# Patient Record
Sex: Female | Born: 1960 | Race: White | Hispanic: No | Smoking: Current every day smoker
Health system: Southern US, Community
[De-identification: ages and names within clinical notes are randomized; demographics above are authoritative.]

## PROBLEM LIST (undated history)

## (undated) DIAGNOSIS — C44320 Squamous cell carcinoma of skin of unspecified parts of face: Secondary | ICD-10-CM

## (undated) DIAGNOSIS — E042 Nontoxic multinodular goiter: Secondary | ICD-10-CM

## (undated) DIAGNOSIS — E039 Hypothyroidism, unspecified: Secondary | ICD-10-CM

## (undated) DIAGNOSIS — H3509 Other intraretinal microvascular abnormalities: Secondary | ICD-10-CM

## (undated) HISTORY — DX: Nontoxic multinodular goiter: E04.2

## (undated) HISTORY — DX: Squamous cell carcinoma of skin of unspecified parts of face: C44.320

## (undated) HISTORY — DX: Hypothyroidism, unspecified: E03.9

## (undated) HISTORY — DX: Other intraretinal microvascular abnormalities: H35.09

## (undated) HISTORY — PX: ABDOMINAL HYSTERECTOMY: SHX81

---

## 1998-05-26 ENCOUNTER — Other Ambulatory Visit: Admission: RE | Admit: 1998-05-26 | Discharge: 1998-05-26 | Payer: Self-pay | Admitting: Orthopedic Surgery

## 1998-06-30 ENCOUNTER — Other Ambulatory Visit: Admission: RE | Admit: 1998-06-30 | Discharge: 1998-06-30 | Payer: Self-pay | Admitting: Obstetrics & Gynecology

## 1999-07-08 ENCOUNTER — Other Ambulatory Visit: Admission: RE | Admit: 1999-07-08 | Discharge: 1999-07-08 | Payer: Self-pay | Admitting: Obstetrics & Gynecology

## 1999-10-27 ENCOUNTER — Encounter: Admission: RE | Admit: 1999-10-27 | Discharge: 1999-10-27 | Payer: Self-pay | Admitting: Diagnostic Radiology

## 2000-09-22 ENCOUNTER — Encounter: Admission: RE | Admit: 2000-09-22 | Discharge: 2000-09-22 | Payer: Self-pay | Admitting: Diagnostic Radiology

## 2000-09-26 ENCOUNTER — Other Ambulatory Visit: Admission: RE | Admit: 2000-09-26 | Discharge: 2000-09-26 | Payer: Self-pay | Admitting: Obstetrics & Gynecology

## 2001-09-15 ENCOUNTER — Encounter: Admission: RE | Admit: 2001-09-15 | Discharge: 2001-09-15 | Payer: Self-pay | Admitting: Diagnostic Radiology

## 2001-12-07 ENCOUNTER — Other Ambulatory Visit: Admission: RE | Admit: 2001-12-07 | Discharge: 2001-12-07 | Payer: Self-pay | Admitting: Obstetrics & Gynecology

## 2003-01-01 ENCOUNTER — Other Ambulatory Visit: Admission: RE | Admit: 2003-01-01 | Discharge: 2003-01-01 | Payer: Self-pay | Admitting: Obstetrics & Gynecology

## 2003-11-21 ENCOUNTER — Encounter: Admission: RE | Admit: 2003-11-21 | Discharge: 2003-11-21 | Payer: Self-pay | Admitting: Obstetrics & Gynecology

## 2004-01-17 ENCOUNTER — Other Ambulatory Visit: Admission: RE | Admit: 2004-01-17 | Discharge: 2004-01-17 | Payer: Self-pay | Admitting: Obstetrics & Gynecology

## 2005-01-29 ENCOUNTER — Encounter: Admission: RE | Admit: 2005-01-29 | Discharge: 2005-01-29 | Payer: Self-pay | Admitting: Obstetrics & Gynecology

## 2005-02-26 ENCOUNTER — Other Ambulatory Visit: Admission: RE | Admit: 2005-02-26 | Discharge: 2005-02-26 | Payer: Self-pay | Admitting: Obstetrics & Gynecology

## 2005-11-09 ENCOUNTER — Encounter: Admission: RE | Admit: 2005-11-09 | Discharge: 2005-11-09 | Payer: Self-pay | Admitting: Obstetrics & Gynecology

## 2005-12-07 ENCOUNTER — Encounter: Admission: RE | Admit: 2005-12-07 | Discharge: 2005-12-07 | Payer: Self-pay | Admitting: Obstetrics & Gynecology

## 2006-05-17 ENCOUNTER — Encounter: Admission: RE | Admit: 2006-05-17 | Discharge: 2006-05-17 | Payer: Self-pay | Admitting: Obstetrics & Gynecology

## 2006-06-13 ENCOUNTER — Encounter: Admission: RE | Admit: 2006-06-13 | Discharge: 2006-06-13 | Payer: Self-pay | Admitting: Family Medicine

## 2007-01-02 ENCOUNTER — Encounter: Admission: RE | Admit: 2007-01-02 | Discharge: 2007-01-31 | Payer: Self-pay | Admitting: Family Medicine

## 2007-01-28 IMAGING — MG MM SCREEN MAMMOGRAM BILATERAL
5 series · 5 of 5 positions shown · non-contrast
Comparison: none

DG SCREEN MAMMOGRAM BILATERAL
Bilateral CC and MLO view(s) were taken.
Prior study comparison: November 21, 2003, bilateral screening mammogram.

SCREENING MAMMOGRAM:
The breast tissue is extremely dense.  There is no dominant mass, architectural distortion or 
calcification to suggest malignancy.

[R CC]
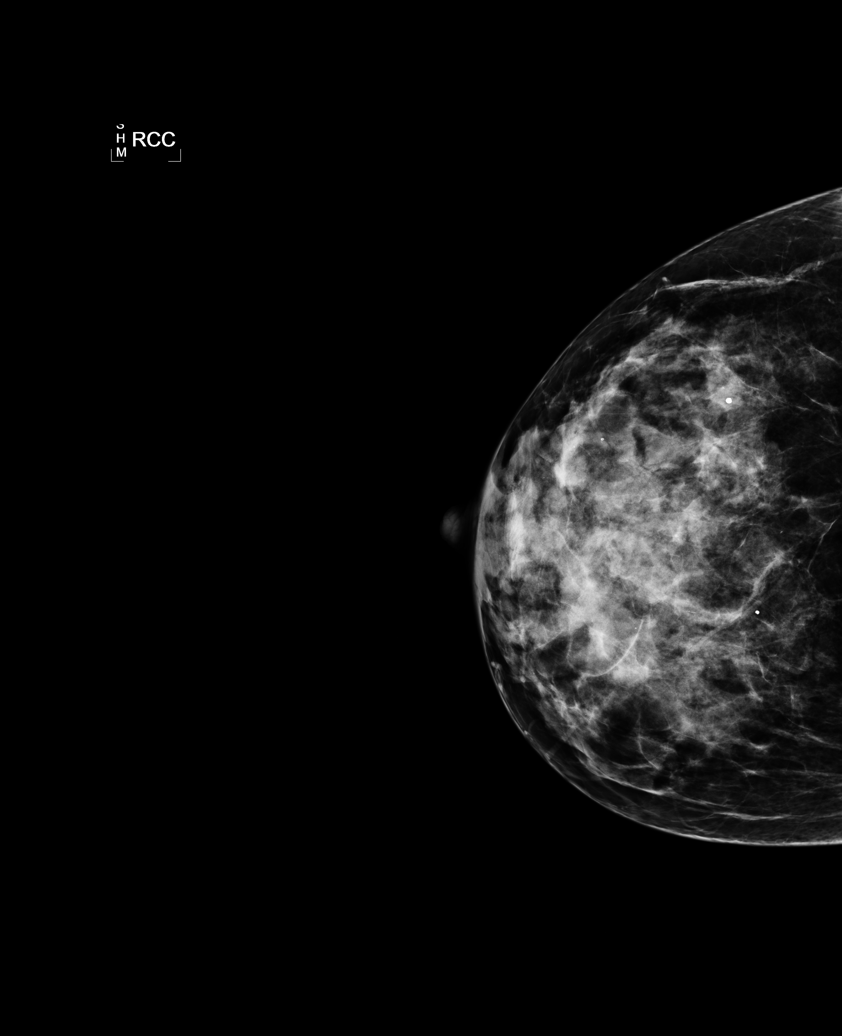

[L CC]
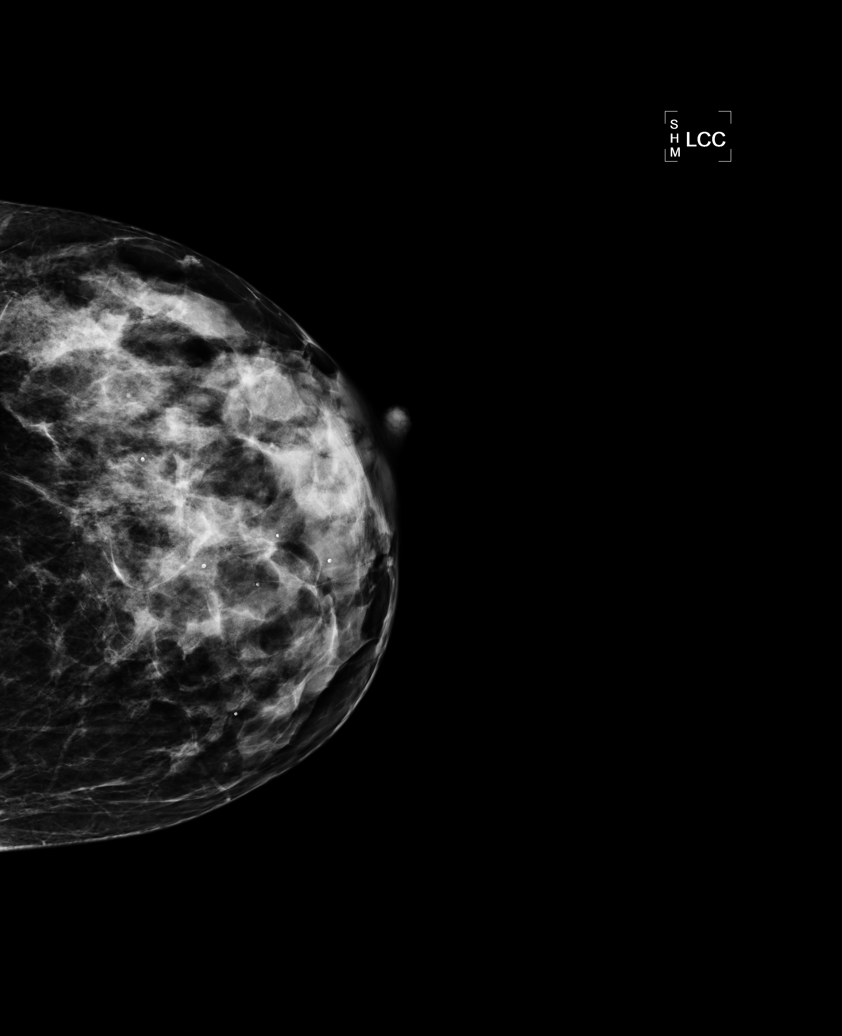

[L MLO (1 of 2)]
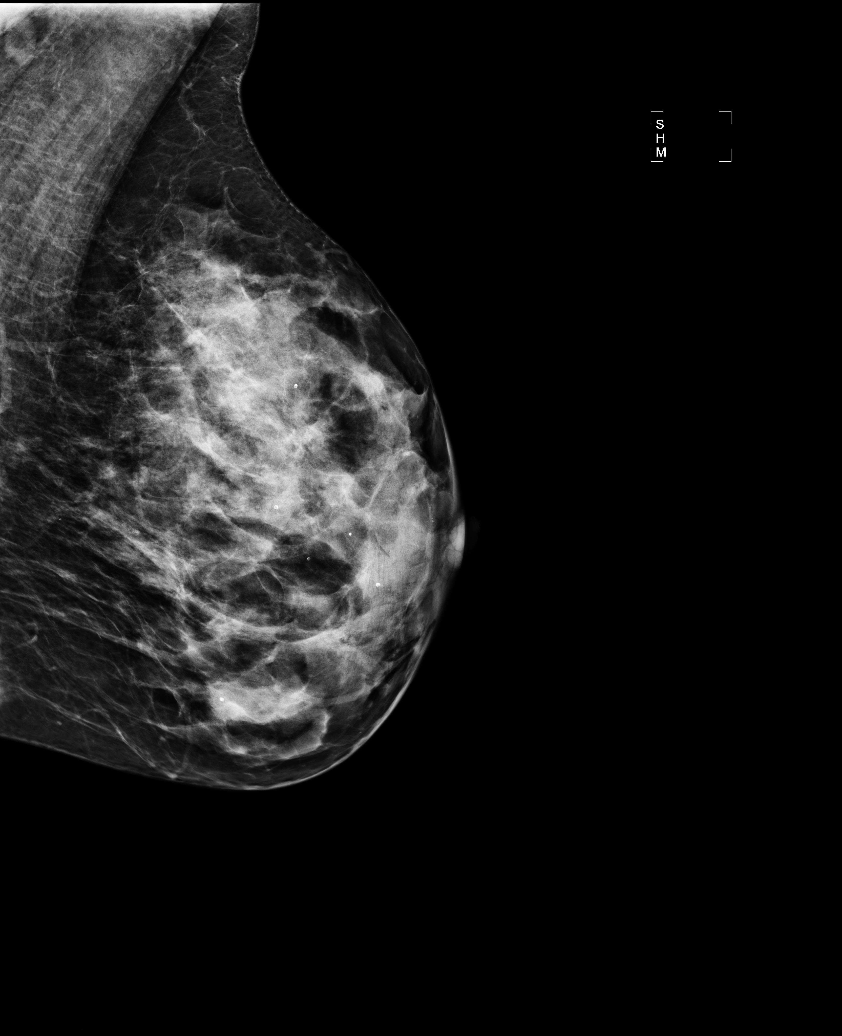

[R MLO]
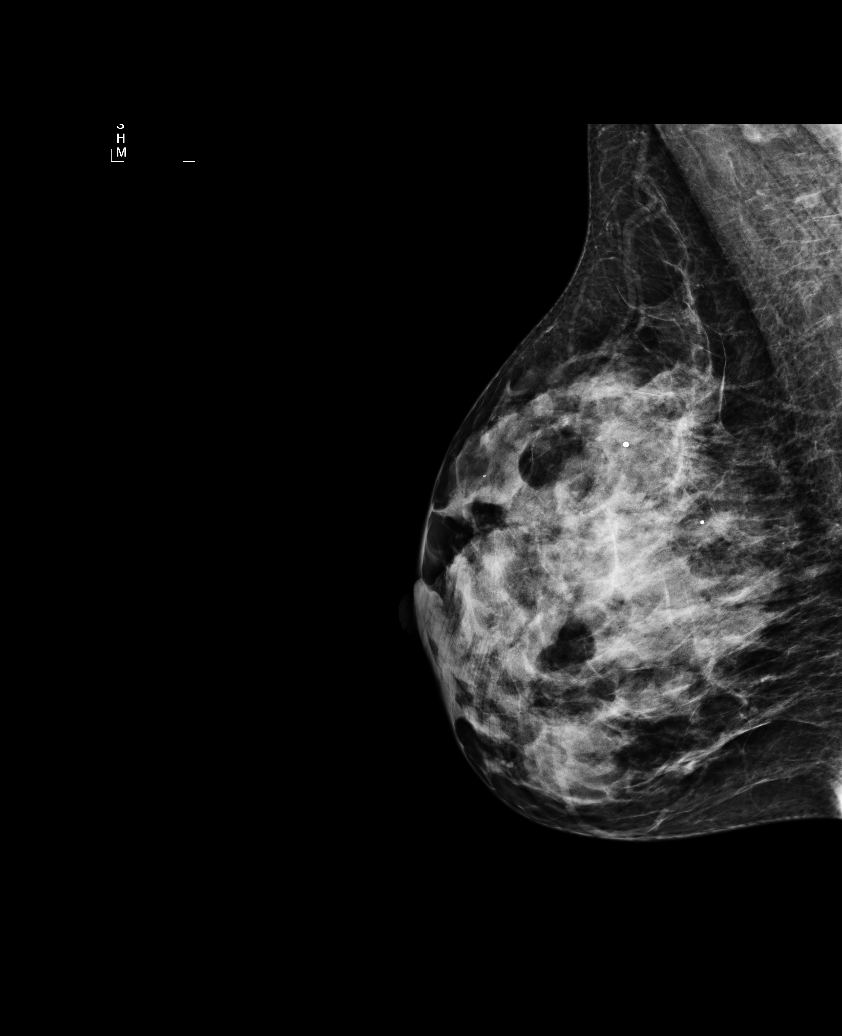

[L MLO (2 of 2)]
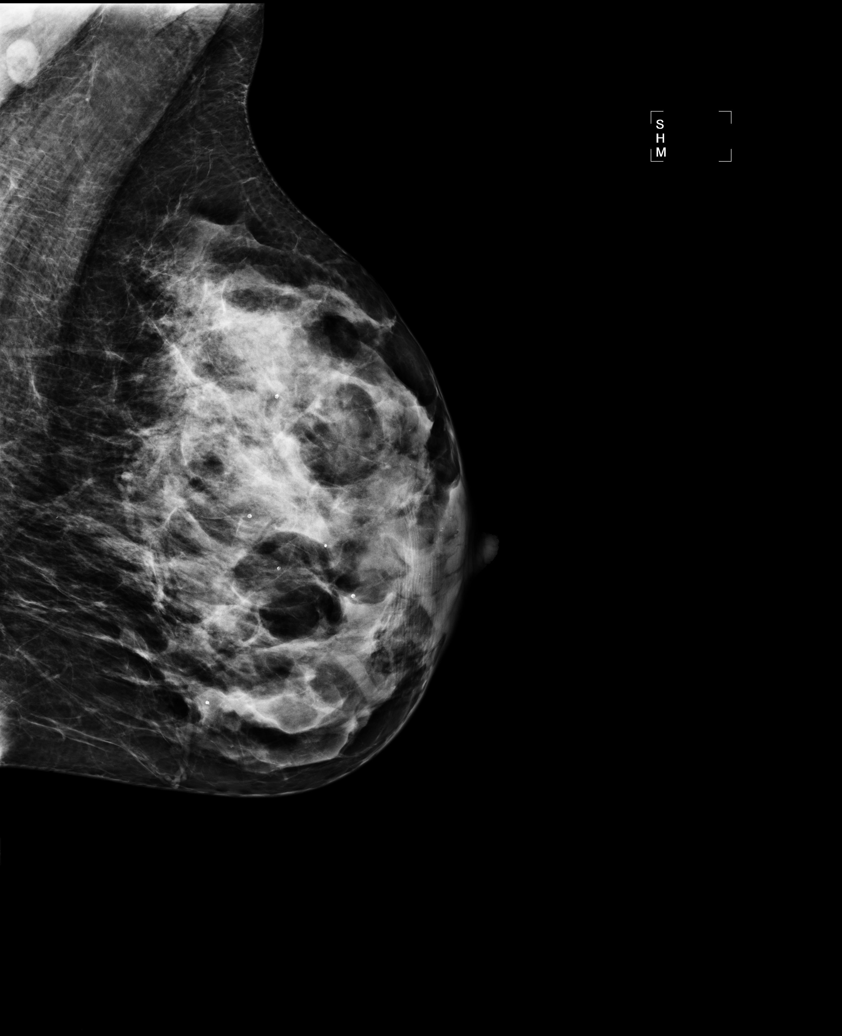

[5 of 5 positions shown; findings below may reference images not displayed]

IMPRESSION: No mammographic evidence of malignancy.  Suggest yearly screening mammography.

ASSESSMENT: Negative - BI-RADS 1

Screening mammogram in 1 year.
ANALYZED BY COMPUTER AIDED DETECTION. , THIS PROCEDURE WAS A DIGITAL MAMMOGRAM.

## 2007-10-10 ENCOUNTER — Encounter: Admission: RE | Admit: 2007-10-10 | Discharge: 2007-10-10 | Payer: Self-pay | Admitting: Family Medicine

## 2008-06-13 ENCOUNTER — Encounter: Admission: RE | Admit: 2008-06-13 | Discharge: 2008-06-13 | Payer: Self-pay | Admitting: Obstetrics & Gynecology

## 2008-11-28 ENCOUNTER — Encounter: Admission: RE | Admit: 2008-11-28 | Discharge: 2008-11-28 | Payer: Self-pay | Admitting: Family Medicine

## 2010-01-14 ENCOUNTER — Encounter: Admission: RE | Admit: 2010-01-14 | Discharge: 2010-01-14 | Payer: Self-pay | Admitting: Family Medicine

## 2010-07-17 ENCOUNTER — Encounter: Admission: RE | Admit: 2010-07-17 | Discharge: 2010-07-17 | Payer: Self-pay | Admitting: Obstetrics & Gynecology

## 2010-11-28 ENCOUNTER — Encounter: Payer: Self-pay | Admitting: Family Medicine

## 2010-11-28 ENCOUNTER — Encounter: Payer: Self-pay | Admitting: Obstetrics & Gynecology

## 2010-11-28 ENCOUNTER — Encounter: Payer: Self-pay | Admitting: Orthopedic Surgery

## 2010-11-29 ENCOUNTER — Encounter: Payer: Self-pay | Admitting: Gynecology

## 2010-11-29 ENCOUNTER — Encounter: Payer: Self-pay | Admitting: Radiology

## 2010-11-29 ENCOUNTER — Encounter: Payer: Self-pay | Admitting: Internal Medicine

## 2010-11-29 ENCOUNTER — Encounter: Payer: Self-pay | Admitting: Family Medicine

## 2010-11-30 ENCOUNTER — Encounter: Payer: Self-pay | Admitting: Orthopedic Surgery

## 2010-11-30 ENCOUNTER — Encounter: Payer: Self-pay | Admitting: Family Medicine

## 2010-11-30 ENCOUNTER — Encounter: Payer: Self-pay | Admitting: Obstetrics & Gynecology

## 2011-01-26 ENCOUNTER — Other Ambulatory Visit: Payer: Self-pay | Admitting: Obstetrics and Gynecology

## 2011-01-26 ENCOUNTER — Other Ambulatory Visit: Payer: Self-pay | Admitting: Family Medicine

## 2011-03-16 ENCOUNTER — Other Ambulatory Visit: Payer: Self-pay | Admitting: Family Medicine

## 2011-03-19 ENCOUNTER — Other Ambulatory Visit: Payer: Self-pay | Admitting: Family Medicine

## 2011-03-30 ENCOUNTER — Other Ambulatory Visit: Payer: Self-pay | Admitting: Podiatrist

## 2011-03-30 ENCOUNTER — Other Ambulatory Visit: Payer: Self-pay | Admitting: Orthopedic Surgery

## 2011-08-17 ENCOUNTER — Other Ambulatory Visit: Payer: Self-pay | Admitting: Family Medicine

## 2011-08-17 DIAGNOSIS — R51 Headache: Secondary | ICD-10-CM

## 2011-11-10 ENCOUNTER — Other Ambulatory Visit: Payer: Self-pay | Admitting: Pediatrics

## 2011-11-19 ENCOUNTER — Other Ambulatory Visit: Payer: Self-pay | Admitting: Family Medicine

## 2011-12-09 ENCOUNTER — Other Ambulatory Visit: Payer: Self-pay | Admitting: Family Medicine

## 2012-10-09 ENCOUNTER — Other Ambulatory Visit: Payer: Self-pay | Admitting: Family Medicine

## 2012-10-09 DIAGNOSIS — Z1231 Encounter for screening mammogram for malignant neoplasm of breast: Secondary | ICD-10-CM

## 2012-11-10 ENCOUNTER — Ambulatory Visit: Payer: Self-pay

## 2012-12-18 ENCOUNTER — Ambulatory Visit
Admission: RE | Admit: 2012-12-18 | Discharge: 2012-12-18 | Disposition: A | Discharge status: Home / Self Care | Payer: PRIVATE HEALTH INSURANCE | Source: Ambulatory Visit | Attending: Family Medicine | Admitting: Family Medicine

## 2012-12-18 DIAGNOSIS — Z1231 Encounter for screening mammogram for malignant neoplasm of breast: Secondary | ICD-10-CM

## 2013-04-10 ENCOUNTER — Other Ambulatory Visit: Payer: Self-pay | Admitting: Optometry

## 2013-04-10 DIAGNOSIS — I878 Other specified disorders of veins: Secondary | ICD-10-CM

## 2013-04-10 DIAGNOSIS — H35032 Hypertensive retinopathy, left eye: Secondary | ICD-10-CM

## 2013-04-16 ENCOUNTER — Other Ambulatory Visit: Payer: PRIVATE HEALTH INSURANCE

## 2013-04-16 ENCOUNTER — Ambulatory Visit
Admission: RE | Admit: 2013-04-16 | Discharge: 2013-04-16 | Disposition: A | Discharge status: Home / Self Care | Payer: PRIVATE HEALTH INSURANCE | Source: Ambulatory Visit | Attending: Optometry | Admitting: Optometry

## 2013-04-16 DIAGNOSIS — H35032 Hypertensive retinopathy, left eye: Secondary | ICD-10-CM

## 2013-04-16 DIAGNOSIS — I878 Other specified disorders of veins: Secondary | ICD-10-CM

## 2013-08-07 ENCOUNTER — Encounter: Payer: Self-pay | Admitting: *Deleted

## 2013-08-22 ENCOUNTER — Other Ambulatory Visit (HOSPITAL_COMMUNITY): Payer: Self-pay | Admitting: Cardiology

## 2013-08-22 ENCOUNTER — Other Ambulatory Visit: Payer: Self-pay | Admitting: Cardiology

## 2013-08-22 ENCOUNTER — Encounter: Payer: Self-pay | Admitting: Nurse Practitioner

## 2013-08-22 ENCOUNTER — Ambulatory Visit (INDEPENDENT_AMBULATORY_CARE_PROVIDER_SITE_OTHER): Payer: PRIVATE HEALTH INSURANCE | Admitting: Nurse Practitioner

## 2013-08-22 ENCOUNTER — Ambulatory Visit (HOSPITAL_COMMUNITY): Payer: PRIVATE HEALTH INSURANCE | Attending: Cardiology | Admitting: Cardiology

## 2013-08-22 VITALS — BP 113/83 | HR 90

## 2013-08-22 DIAGNOSIS — R079 Chest pain, unspecified: Secondary | ICD-10-CM | POA: Insufficient documentation

## 2013-08-22 DIAGNOSIS — I253 Aneurysm of heart: Secondary | ICD-10-CM | POA: Insufficient documentation

## 2013-08-22 DIAGNOSIS — R0789 Other chest pain: Secondary | ICD-10-CM

## 2013-08-22 DIAGNOSIS — R9439 Abnormal result of other cardiovascular function study: Secondary | ICD-10-CM

## 2013-08-22 DIAGNOSIS — F17200 Nicotine dependence, unspecified, uncomplicated: Secondary | ICD-10-CM | POA: Insufficient documentation

## 2013-08-22 NOTE — Progress Notes (Signed)
Exercise Treadmill Test  Pre-Exercise Testing Evaluation Rhythm: normal sinus  Rate: 66     Test  Exercise Tolerance Test Ordering MD: Donato Schultz, MD  Interpreting MD: Norma Fredrickson, NP  Unique Test No: 1  Treadmill:  1  Indication for ETT: chest pain - rule out ischemia  Contraindication to ETT: No   Stress Modality: exercise - treadmill  Cardiac Imaging Performed: non   Protocol: standard Bruce - maximal  Max BP:  185/81  Max MPHR (bpm):  155 85% MPR (bpm):  145  MPHR obtained (bpm):  170 % MPHR obtained:    Reached 85% MPHR (min:sec):  6:10 Total Exercise Time (min-sec):  8 minutes  Workload in METS:  10.1 Borg Scale: 15  Reason ETT Terminated:  patient's desire to stop    ST Segment Analysis At Rest: normal ST segments - no evidence of significant ST depression With Exercise: borderline ST changes with ST upsloping  Other Information Arrhythmia:  No Angina during ETT:  absent (0) Quality of ETT:  non-diagnostic  ETT Interpretation:  borderline (indeterminate) with non-specific ST changes  Comments: Patient presents today for routine GXT. Has had recurrent retinal vein occlusion noted on eye exams. Negative carotid duplex. She does smoke but trying to quit. Some atypical chest pain. Has had echo earlier today.   Today the patient exercised on the standard Bruce protocol for a total of 8 minutes.  Fair exercise tolerance.  Adequate blood pressure response.  Clinically negative for chest pain. Test was stopped due to fatigue.  EKG with greater than 2 mm of ST upsloping at peak of exercise. No significant arrhythmia noted.   Recommendations: Tracings have been reviewed with Dr. Anne Fu - will proceed with stress Myoview.  I have asked her to continue to work on smoking cessation. Start baby aspirin as well.   Further disposition to follow.   Patient is agreeable to this plan and will call if any problems develop in the interim.   Rosalio Macadamia, RN, ANP-C [facility] [address] [place], [place]  [address]

## 2013-08-22 NOTE — Progress Notes (Deleted)
   Patient ID: Robin Torres, female    DOB: 04/02/1961, 52 y.o.   MRN: [id number]  HPI    Review of Systems    Physical Exam

## 2013-08-22 NOTE — Progress Notes (Signed)
Echo performed. 

## 2013-08-22 NOTE — Patient Instructions (Signed)
Your physician has requested that you have en exercise stress myoview. For further information please visit [email or web address]. Please follow instruction sheet, as given.   

## 2013-08-22 NOTE — Progress Notes (Signed)
Discussed with Lawson Fiscal. We will proceed with nuclear stress test. Abnormal. ST segment depression, upsloping but greater than 2 mm.

## 2013-09-11 ENCOUNTER — Encounter (HOSPITAL_COMMUNITY): Payer: PRIVATE HEALTH INSURANCE

## 2013-09-27 ENCOUNTER — Encounter: Payer: Self-pay | Admitting: Podiatry

## 2013-09-27 ENCOUNTER — Ambulatory Visit (INDEPENDENT_AMBULATORY_CARE_PROVIDER_SITE_OTHER): Payer: PRIVATE HEALTH INSURANCE | Admitting: Podiatry

## 2013-09-27 VITALS — Ht 67.0 in | Wt 130.0 lb

## 2013-09-27 DIAGNOSIS — L600 Ingrowing nail: Secondary | ICD-10-CM

## 2013-09-27 DIAGNOSIS — B351 Tinea unguium: Secondary | ICD-10-CM

## 2013-09-27 NOTE — Patient Instructions (Signed)

## 2013-09-27 NOTE — Progress Notes (Signed)
N-SORE, DISCOLORATION L-B/L TOENAIL D-2 YEARS O-SLOWLY C-WORSE A-PRESSURE T-OTC MEDICATION

## 2013-09-27 NOTE — Progress Notes (Signed)
Subjective:     Patient ID: Robin Torres, female   DOB: 21-Oct-1961, 52 y.o.   MRN: [id number]  HPI patient is found to have incurvated hallux nail left medial border this been very tender and discoloration of the right big toe distal two thirds of the nailbed. States the left hallux nail has given her problems for years and the right is more recent   Review of Systems  All other systems reviewed and are negative.       Objective:   Physical Exam  Nursing note and vitals reviewed. Constitutional: She is oriented to person, place, and time.  Cardiovascular: Intact distal pulses.   Musculoskeletal: Normal range of motion.  Neurological: She is oriented to person, place, and time.  Skin: Skin is warm.   patient's neurovascular status is intact and she is found to have an incurvated hallux nail left medial border painful and on the right the distal two thirds of the nail plate is yellow and discolored and slightly lifting on the medial side     Assessment:     Ingrown toenail left hallux medial border. 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 Patient would like to have this fixed I have recommended permanent procedure the left hallux medial border explaining the surgery and risk with it. Patient wants surgery and today I infiltrated the left hallux 60 mg Xylocaine Marcaine mixture remove the medial border exposed matrix and applied chemical phenol 3 applications followed by alcohol lavage and sterile dressing. Instructed on soaks and gave her soaking instructions and reappoint as needed

## 2013-09-27 NOTE — Progress Notes (Signed)
  Subjective:    Patient ID: Robin Torres, female    DOB: [birth year], 52 y.o.   MRN: [id number]  HPI    Review of Systems  All other systems reviewed and are negative.       Objective:   Physical Exam        Assessment & Plan:

## 2013-12-04 ENCOUNTER — Other Ambulatory Visit: Payer: Self-pay

## 2013-12-04 DIAGNOSIS — Z1231 Encounter for screening mammogram for malignant neoplasm of breast: Secondary | ICD-10-CM

## 2013-12-11 ENCOUNTER — Other Ambulatory Visit: Payer: Self-pay | Admitting: Neurosurgery

## 2013-12-19 ENCOUNTER — Ambulatory Visit
Admission: RE | Admit: 2013-12-19 | Discharge: 2013-12-19 | Disposition: A | Discharge status: Home / Self Care | Payer: PRIVATE HEALTH INSURANCE | Source: Ambulatory Visit

## 2013-12-19 DIAGNOSIS — Z1231 Encounter for screening mammogram for malignant neoplasm of breast: Secondary | ICD-10-CM

## 2014-01-09 ENCOUNTER — Telehealth: Payer: Self-pay | Admitting: Cardiology

## 2014-01-09 NOTE — Telephone Encounter (Signed)
New message         Pt has questions about her nuclear stress test. Can you call pt to answer any questions she has.

## 2014-01-11 NOTE — Telephone Encounter (Signed)
Spoke with pt and went over instructions with her.

## 2014-01-16 ENCOUNTER — Ambulatory Visit (HOSPITAL_COMMUNITY): Payer: PRIVATE HEALTH INSURANCE | Attending: Cardiovascular Disease | Admitting: Radiology

## 2014-01-16 VITALS — BP 113/82 | Ht 65.0 in | Wt 139.0 lb

## 2014-01-16 DIAGNOSIS — R079 Chest pain, unspecified: Secondary | ICD-10-CM

## 2014-01-16 DIAGNOSIS — R9439 Abnormal result of other cardiovascular function study: Secondary | ICD-10-CM | POA: Insufficient documentation

## 2014-01-16 MED ORDER — TECHNETIUM TC 99M SESTAMIBI GENERIC - CARDIOLITE
11.0000 | Freq: Once | INTRAVENOUS | Status: AC | PRN
  Administered 2014-01-16: 11 via INTRAVENOUS

## 2014-01-16 MED ORDER — TECHNETIUM TC 99M SESTAMIBI GENERIC - CARDIOLITE
33.0000 | Freq: Once | INTRAVENOUS | Status: AC | PRN
  Administered 2014-01-16: 33 via INTRAVENOUS

## 2014-01-16 NOTE — Progress Notes (Signed)
[facility] NUCLE[facility] [place],  [address] [phone]    Cardiology Nuclear Med Study  Robin Torres is a 53 y.o. female     MRN : [id number]     DOB: [birth year]  Procedure Date: 01/16/2014  Nuclear Med Background Indication for Stress Test:  Evaluation for Ischemia and 08-2013 Abnormal GXT History:  10/14 Abn GXT ECHO; EF: 60-65% Cardiac Risk Factors: Smoker  Symptoms:  Chest Pain and Chest Pressure   Nuclear Pre-Procedure Caffeine/Decaff Intake:  None > 12 hrs NPO After: 6:30pm   Lungs:  clear O2 Sat: 96% on room air. IV 0.9% NS with Angio Cath:  22g  IV Site: R Antecubital x 1, tolerated well IV Started by:  Irven Baltimore, RN  Chest Size (in):  34 Cup Size: C  Height: 5\' 5"  (1.651 m)  Weight:  139 lb (63.05 kg)  BMI:  Body mass index is 23.13 kg/(m^2). Tech Comments:  N/A    Nuclear Med Study 1 or 2 day study: 1 day  Stress Test Type:  Stress  Reading MD: N/A  Order Authorizing Provider:  Candee Furbish, MD  Resting Radionuclide: Technetium 61m Sestamibi  Resting Radionuclide Dose: 11.0 mCi   Stress Radionuclide:  Technetium 28m Sestamibi  Stress Radionuclide Dose: 33.0 mCi           Stress Protocol Rest HR: 63 Stress HR: 160  Rest BP: 113/82 Stress BP: 158/91  Exercise Time (min): 10:00 METS: 11.70   Predicted Max HR: 169 bpm % Max HR: 94.67 bpm Rate Pressure Product: 25280   Dose of Adenosine (mg):  n/a Dose of Lexiscan: n/a mg  Dose of Atropine (mg): n/a Dose of Dobutamine: n/a mcg/kg/min (at max HR)  Stress Test Technologist: Perrin Maltese, EMT-P  Nuclear Technologist:  Charlton Amor, CNMT     Rest Procedure:  Myocardial perfusion imaging was performed at rest 45 minutes following the intravenous administration of Technetium 47m Sestamibi. Rest ECG: NSR - Normal EKG  Stress Procedure:  The patient exercised on the treadmill utilizing the Bruce Protocol for 10:00 minutes. The patient stopped due to fatigue and  denied any chest pain.  Technetium 78m Sestamibi was injected at peak exercise and myocardial perfusion imaging was performed after a brief delay. Stress ECG: No significant change from baseline ECG  QPS Raw Data Images:  There is interference from nuclear activity from structures below the diaphragm. This does not affect the ability to read the study. Stress Images:  There is mildly reduced uptake in the inferior septum Rest Images:  Comparison with the stress images reveals no significant change. Subtraction (SDS):  No evidence of ischemia. Transient Ischemic Dilatation (Normal <1.22):  1.00 Lung/Heart Ratio (Normal <0.45):  0.44  Quantitative Gated Spect Images QGS EDV:  64 ml QGS ESV:  18 ml  Impression Exercise Capacity:  Good exercise capacity. BP Response:  Normal blood pressure response. Clinical Symptoms:  No significant symptoms noted. ECG Impression:  No significant ST segment change suggestive of ischemia. Comparison with Prior Nuclear Study: No previous nuclear study performed  Overall Impression:  Low risk stress nuclear study with a probable diaphragmatic attenuation artifact. No reversible ischemia is seen.  LV Ejection Fraction: 70%.  LV Wall Motion:  NL LV Function; NL Wall Motion  Sanda Klein, MD, [facility] [phone] office [phone] pager

## 2014-12-26 ENCOUNTER — Other Ambulatory Visit: Payer: Self-pay

## 2014-12-26 DIAGNOSIS — Z1231 Encounter for screening mammogram for malignant neoplasm of breast: Secondary | ICD-10-CM

## 2015-01-01 ENCOUNTER — Ambulatory Visit
Admission: RE | Admit: 2015-01-01 | Discharge: 2015-01-01 | Disposition: A | Discharge status: Home / Self Care | Payer: PRIVATE HEALTH INSURANCE | Source: Ambulatory Visit

## 2015-01-01 ENCOUNTER — Encounter (INDEPENDENT_AMBULATORY_CARE_PROVIDER_SITE_OTHER): Payer: Self-pay

## 2015-01-01 DIAGNOSIS — Z1231 Encounter for screening mammogram for malignant neoplasm of breast: Secondary | ICD-10-CM

## 2015-12-05 ENCOUNTER — Other Ambulatory Visit: Payer: Self-pay

## 2015-12-05 DIAGNOSIS — Z1231 Encounter for screening mammogram for malignant neoplasm of breast: Secondary | ICD-10-CM

## 2016-01-05 ENCOUNTER — Ambulatory Visit
Admission: RE | Admit: 2016-01-05 | Discharge: 2016-01-05 | Disposition: A | Discharge status: Home / Self Care | Payer: PRIVATE HEALTH INSURANCE | Source: Ambulatory Visit

## 2016-01-05 DIAGNOSIS — Z1231 Encounter for screening mammogram for malignant neoplasm of breast: Secondary | ICD-10-CM

## 2017-01-05 ENCOUNTER — Other Ambulatory Visit: Payer: Self-pay | Admitting: Obstetrics & Gynecology

## 2017-01-05 ENCOUNTER — Ambulatory Visit
Admission: RE | Admit: 2017-01-05 | Discharge: 2017-01-05 | Disposition: A | Discharge status: Home / Self Care | Payer: PRIVATE HEALTH INSURANCE | Source: Ambulatory Visit | Attending: Obstetrics & Gynecology | Admitting: Obstetrics & Gynecology

## 2017-01-05 DIAGNOSIS — Z1231 Encounter for screening mammogram for malignant neoplasm of breast: Secondary | ICD-10-CM

## 2017-12-16 ENCOUNTER — Other Ambulatory Visit: Payer: Self-pay | Admitting: Obstetrics & Gynecology

## 2017-12-16 DIAGNOSIS — Z1231 Encounter for screening mammogram for malignant neoplasm of breast: Secondary | ICD-10-CM

## 2018-01-06 ENCOUNTER — Ambulatory Visit
Admission: RE | Admit: 2018-01-06 | Discharge: 2018-01-06 | Disposition: A | Discharge status: Home / Self Care | Payer: PRIVATE HEALTH INSURANCE | Source: Ambulatory Visit | Attending: Obstetrics & Gynecology | Admitting: Obstetrics & Gynecology

## 2018-01-06 DIAGNOSIS — Z1231 Encounter for screening mammogram for malignant neoplasm of breast: Secondary | ICD-10-CM

## 2018-01-09 ENCOUNTER — Other Ambulatory Visit: Payer: Self-pay | Admitting: Obstetrics & Gynecology

## 2018-01-09 DIAGNOSIS — R928 Other abnormal and inconclusive findings on diagnostic imaging of breast: Secondary | ICD-10-CM

## 2018-01-11 ENCOUNTER — Ambulatory Visit
Admission: RE | Admit: 2018-01-11 | Discharge: 2018-01-11 | Disposition: A | Discharge status: Home / Self Care | Payer: PRIVATE HEALTH INSURANCE | Source: Ambulatory Visit | Attending: Obstetrics & Gynecology | Admitting: Obstetrics & Gynecology

## 2018-01-11 DIAGNOSIS — R928 Other abnormal and inconclusive findings on diagnostic imaging of breast: Secondary | ICD-10-CM

## 2018-05-04 ENCOUNTER — Other Ambulatory Visit: Payer: Self-pay | Admitting: Radiology

## 2018-05-04 ENCOUNTER — Ambulatory Visit
Admission: RE | Admit: 2018-05-04 | Discharge: 2018-05-04 | Disposition: A | Discharge status: Home / Self Care | Payer: Self-pay | Source: Ambulatory Visit | Attending: Radiology | Admitting: Radiology

## 2018-05-04 DIAGNOSIS — Z1239 Encounter for other screening for malignant neoplasm of breast: Secondary | ICD-10-CM

## 2018-05-04 MED ORDER — GADOBENATE DIMEGLUMINE 529 MG/ML IV SOLN
6.0000 mL | Freq: Once | INTRAVENOUS | Status: AC | PRN
  Administered 2018-05-04: 6 mL via INTRAVENOUS

## 2018-12-21 ENCOUNTER — Other Ambulatory Visit: Payer: Self-pay | Admitting: Obstetrics & Gynecology

## 2018-12-21 DIAGNOSIS — Z1231 Encounter for screening mammogram for malignant neoplasm of breast: Secondary | ICD-10-CM

## 2019-01-15 ENCOUNTER — Ambulatory Visit
Admission: RE | Admit: 2019-01-15 | Discharge: 2019-01-15 | Disposition: A | Discharge status: Home / Self Care | Payer: PRIVATE HEALTH INSURANCE | Source: Ambulatory Visit | Attending: Obstetrics & Gynecology | Admitting: Obstetrics & Gynecology

## 2019-01-15 ENCOUNTER — Ambulatory Visit: Payer: Self-pay

## 2019-01-15 DIAGNOSIS — Z1231 Encounter for screening mammogram for malignant neoplasm of breast: Secondary | ICD-10-CM

## 2019-02-20 MED FILL — LEVOTHYROXINE 125 MCG TAB: 125 | 30 days supply | Qty: 30 | Fill #0

## 2019-03-23 MED FILL — LEVOTHYROXINE 125 MCG TAB: 125 | 30 days supply | Qty: 30 | Fill #1

## 2019-04-23 MED FILL — LEVOTHYROXINE 125 MCG TAB: 125 | 30 days supply | Qty: 30 | Fill #2

## 2020-01-23 ENCOUNTER — Other Ambulatory Visit: Payer: Self-pay | Admitting: Obstetrics & Gynecology

## 2020-01-23 DIAGNOSIS — Z1231 Encounter for screening mammogram for malignant neoplasm of breast: Secondary | ICD-10-CM

## 2020-02-07 ENCOUNTER — Ambulatory Visit: Payer: PRIVATE HEALTH INSURANCE

## 2020-02-13 ENCOUNTER — Ambulatory Visit
Admission: RE | Admit: 2020-02-13 | Discharge: 2020-02-13 | Disposition: A | Discharge status: Home / Self Care | Payer: Managed Care, Other (non HMO) | Source: Ambulatory Visit | Attending: Obstetrics & Gynecology | Admitting: Obstetrics & Gynecology

## 2020-02-13 ENCOUNTER — Other Ambulatory Visit: Payer: Self-pay

## 2020-02-13 DIAGNOSIS — Z1231 Encounter for screening mammogram for malignant neoplasm of breast: Secondary | ICD-10-CM

## 2020-08-13 ENCOUNTER — Other Ambulatory Visit (HOSPITAL_COMMUNITY): Payer: Self-pay | Admitting: Internal Medicine

## 2020-08-13 ENCOUNTER — Ambulatory Visit: Payer: PRIVATE HEALTH INSURANCE | Attending: Internal Medicine

## 2020-08-13 DIAGNOSIS — Z23 Encounter for immunization: Secondary | ICD-10-CM

## 2020-08-13 NOTE — Progress Notes (Signed)
   Covid-19 Vaccination Clinic  Name:  Robin Torres    MRN: [id number] DOB: [birth year]  08/13/2020  [name] was observed post Covid-19 immunization for 15 minutes without incident. She was provided with Vaccine Information Sheet and instruction to access the V-Safe system.   [name] was instructed to call [phone] with any severe reactions post vaccine: Marland Kitchen Difficulty breathing  . Swelling of face and throat  . A fast heartbeat  . A bad rash all over body  . Dizziness and weakness

## 2020-08-25 ENCOUNTER — Other Ambulatory Visit (HOSPITAL_COMMUNITY): Payer: Self-pay | Admitting: Specialist

## 2020-11-20 ENCOUNTER — Other Ambulatory Visit (HOSPITAL_COMMUNITY): Payer: Self-pay | Admitting: Family Medicine

## 2020-12-05 ENCOUNTER — Other Ambulatory Visit (HOSPITAL_COMMUNITY): Payer: Self-pay | Admitting: Family Medicine

## 2021-01-06 ENCOUNTER — Other Ambulatory Visit: Payer: Self-pay | Admitting: Obstetrics & Gynecology

## 2021-01-06 DIAGNOSIS — Z1231 Encounter for screening mammogram for malignant neoplasm of breast: Secondary | ICD-10-CM

## 2021-02-19 ENCOUNTER — Other Ambulatory Visit (HOSPITAL_COMMUNITY): Payer: Self-pay

## 2021-02-19 MED FILL — Levothyroxine Sodium Tab 125 MCG: ORAL | 30 days supply | Qty: 30 | Fill #0 | Status: AC

## 2021-02-26 ENCOUNTER — Inpatient Hospital Stay: Admission: RE | Admit: 2021-02-26 | Payer: PRIVATE HEALTH INSURANCE | Source: Ambulatory Visit

## 2021-03-19 ENCOUNTER — Other Ambulatory Visit (HOSPITAL_COMMUNITY): Payer: Self-pay

## 2021-03-19 MED ORDER — ALPRAZOLAM 0.5 MG PO TABS
0.5000 mg | ORAL_TABLET | Freq: Three times a day (TID) | ORAL | 3 refills | Status: AC | PRN
  Filled 2021-03-19: qty 90, 30d supply, fill #0
  Filled 2021-05-20: qty 90, 30d supply, fill #1
  Filled 2021-06-23: qty 90, 30d supply, fill #2
  Filled 2021-06-24: qty 90, 30d supply, fill #0

## 2021-03-23 ENCOUNTER — Other Ambulatory Visit (HOSPITAL_COMMUNITY): Payer: Self-pay

## 2021-03-23 MED FILL — Levothyroxine Sodium Tab 125 MCG: ORAL | 30 days supply | Qty: 30 | Fill #1 | Status: AC

## 2021-03-27 ENCOUNTER — Other Ambulatory Visit (HOSPITAL_COMMUNITY): Payer: Self-pay

## 2021-03-30 ENCOUNTER — Other Ambulatory Visit (HOSPITAL_COMMUNITY): Payer: Self-pay

## 2021-04-01 ENCOUNTER — Other Ambulatory Visit (HOSPITAL_COMMUNITY): Payer: Self-pay

## 2021-04-01 MED ORDER — TRIAMCINOLONE ACETONIDE 0.1 % EX CREA
TOPICAL_CREAM | CUTANEOUS | 0 refills | Status: AC
  Filled 2021-04-01: qty 30, 30d supply, fill #0
  Filled 2021-07-29: qty 15, 10d supply, fill #0

## 2021-04-15 ENCOUNTER — Other Ambulatory Visit (HOSPITAL_COMMUNITY): Payer: Self-pay

## 2021-04-16 ENCOUNTER — Other Ambulatory Visit: Payer: Self-pay

## 2021-04-16 ENCOUNTER — Ambulatory Visit
Admission: RE | Admit: 2021-04-16 | Discharge: 2021-04-16 | Disposition: A | Discharge status: Home / Self Care | Payer: Managed Care, Other (non HMO) | Source: Ambulatory Visit | Attending: Obstetrics & Gynecology | Admitting: Obstetrics & Gynecology

## 2021-04-16 DIAGNOSIS — Z1231 Encounter for screening mammogram for malignant neoplasm of breast: Secondary | ICD-10-CM

## 2021-04-21 ENCOUNTER — Other Ambulatory Visit (HOSPITAL_COMMUNITY): Payer: Self-pay

## 2021-04-21 MED FILL — Levothyroxine Sodium Tab 125 MCG: ORAL | 30 days supply | Qty: 30 | Fill #2 | Status: AC

## 2021-05-20 ENCOUNTER — Other Ambulatory Visit (HOSPITAL_COMMUNITY): Payer: Self-pay

## 2021-05-20 MED ORDER — AMOXICILLIN 500 MG PO CAPS
ORAL_CAPSULE | ORAL | 0 refills | Status: AC
  Filled 2021-05-20: qty 40, 10d supply, fill #0

## 2021-05-20 MED FILL — Levothyroxine Sodium Tab 125 MCG: ORAL | 30 days supply | Qty: 30 | Fill #3 | Status: AC

## 2021-06-23 MED FILL — Levothyroxine Sodium Tab 125 MCG: ORAL | 30 days supply | Qty: 30 | Fill #4 | Status: CN

## 2021-06-24 ENCOUNTER — Other Ambulatory Visit (HOSPITAL_COMMUNITY): Payer: Self-pay

## 2021-06-24 MED ORDER — LEVOTHYROXINE SODIUM 125 MCG PO TABS
125.0000 ug | ORAL_TABLET | Freq: Every day | ORAL | 3 refills | Status: AC
  Filled 2021-07-29: qty 90, 90d supply, fill #0
  Filled 2021-10-27: qty 90, 90d supply, fill #1
  Filled 2022-01-20: qty 34, 34d supply, fill #2
  Filled 2022-02-25: qty 34, 34d supply, fill #3

## 2021-06-24 MED FILL — Levothyroxine Sodium Tab 125 MCG: ORAL | 30 days supply | Qty: 30 | Fill #0 | Status: AC

## 2021-07-29 ENCOUNTER — Other Ambulatory Visit (HOSPITAL_COMMUNITY): Payer: Self-pay

## 2021-10-27 ENCOUNTER — Other Ambulatory Visit (HOSPITAL_COMMUNITY): Payer: Self-pay

## 2021-12-08 ENCOUNTER — Other Ambulatory Visit (HOSPITAL_COMMUNITY): Payer: Self-pay

## 2021-12-08 MED ORDER — LEVOTHYROXINE SODIUM 125 MCG PO TABS
125.0000 ug | ORAL_TABLET | Freq: Every day | ORAL | 3 refills | Status: DC
  Filled 2021-12-08: qty 90, 90d supply, fill #0
  Filled 2022-04-08: qty 30, 30d supply, fill #0
  Filled 2022-05-06: qty 30, 30d supply, fill #1
  Filled 2022-06-07: qty 30, 30d supply, fill #2
  Filled 2022-07-09: qty 30, 30d supply, fill #3
  Filled 2022-08-04: qty 30, 30d supply, fill #4
  Filled 2022-09-03: qty 30, 30d supply, fill #5
  Filled 2022-10-06: qty 30, 30d supply, fill #6
  Filled 2022-11-11: qty 30, 30d supply, fill #7

## 2021-12-10 ENCOUNTER — Other Ambulatory Visit (HOSPITAL_COMMUNITY): Payer: Self-pay

## 2021-12-10 MED ORDER — ALPRAZOLAM 0.5 MG PO TABS
0.5000 mg | ORAL_TABLET | Freq: Two times a day (BID) | ORAL | 3 refills | Status: AC | PRN
  Filled 2021-12-10: qty 90, 45d supply, fill #0

## 2022-01-20 ENCOUNTER — Other Ambulatory Visit: Payer: Self-pay

## 2022-01-25 ENCOUNTER — Other Ambulatory Visit: Payer: Self-pay

## 2022-02-25 ENCOUNTER — Other Ambulatory Visit: Payer: Self-pay

## 2022-02-25 ENCOUNTER — Other Ambulatory Visit (HOSPITAL_COMMUNITY): Payer: Self-pay

## 2022-04-07 ENCOUNTER — Other Ambulatory Visit: Payer: Self-pay | Admitting: Family Medicine

## 2022-04-07 ENCOUNTER — Other Ambulatory Visit: Payer: Managed Care, Other (non HMO)

## 2022-04-07 DIAGNOSIS — R0602 Shortness of breath: Secondary | ICD-10-CM

## 2022-04-08 ENCOUNTER — Other Ambulatory Visit: Payer: Self-pay

## 2022-04-15 ENCOUNTER — Other Ambulatory Visit: Payer: Self-pay

## 2022-04-15 MED ORDER — ESTRADIOL 1 MG PO TABS
ORAL_TABLET | ORAL | 4 refills | Status: AC
  Filled 2022-04-15 – 2022-04-22 (×2): qty 30, 30d supply, fill #0

## 2022-04-16 ENCOUNTER — Other Ambulatory Visit (HOSPITAL_COMMUNITY): Payer: Self-pay

## 2022-04-19 ENCOUNTER — Other Ambulatory Visit: Payer: Self-pay

## 2022-04-19 MED ORDER — ALPRAZOLAM 0.5 MG PO TABS
ORAL_TABLET | ORAL | 0 refills | Status: AC
  Filled 2022-04-19: qty 50, 17d supply, fill #0

## 2022-04-21 ENCOUNTER — Other Ambulatory Visit: Payer: Self-pay

## 2022-04-22 ENCOUNTER — Other Ambulatory Visit: Payer: Self-pay

## 2022-04-23 ENCOUNTER — Other Ambulatory Visit: Payer: Self-pay

## 2022-05-03 ENCOUNTER — Other Ambulatory Visit: Payer: Self-pay | Admitting: Obstetrics and Gynecology

## 2022-05-03 DIAGNOSIS — Z1231 Encounter for screening mammogram for malignant neoplasm of breast: Secondary | ICD-10-CM

## 2022-05-06 ENCOUNTER — Other Ambulatory Visit (HOSPITAL_COMMUNITY): Payer: Self-pay

## 2022-05-06 ENCOUNTER — Other Ambulatory Visit: Payer: Self-pay

## 2022-05-07 ENCOUNTER — Ambulatory Visit
Admission: RE | Admit: 2022-05-07 | Discharge: 2022-05-07 | Disposition: A | Discharge status: Home / Self Care | Payer: Managed Care, Other (non HMO) | Source: Ambulatory Visit | Attending: Obstetrics and Gynecology | Admitting: Obstetrics and Gynecology

## 2022-05-07 DIAGNOSIS — Z1231 Encounter for screening mammogram for malignant neoplasm of breast: Secondary | ICD-10-CM

## 2022-06-07 ENCOUNTER — Other Ambulatory Visit (HOSPITAL_COMMUNITY): Payer: Self-pay

## 2022-07-09 ENCOUNTER — Other Ambulatory Visit (HOSPITAL_COMMUNITY): Payer: Self-pay

## 2022-08-04 ENCOUNTER — Other Ambulatory Visit (HOSPITAL_COMMUNITY): Payer: Self-pay

## 2022-08-17 ENCOUNTER — Other Ambulatory Visit: Payer: Self-pay | Admitting: Family Medicine

## 2022-08-17 DIAGNOSIS — Z021 Encounter for pre-employment examination: Secondary | ICD-10-CM

## 2022-09-03 ENCOUNTER — Other Ambulatory Visit (HOSPITAL_COMMUNITY): Payer: Self-pay

## 2022-10-06 ENCOUNTER — Other Ambulatory Visit (HOSPITAL_COMMUNITY): Payer: Self-pay

## 2022-12-08 ENCOUNTER — Encounter: Payer: Self-pay | Admitting: Family Medicine

## 2022-12-09 ENCOUNTER — Other Ambulatory Visit (HOSPITAL_COMMUNITY): Payer: Self-pay

## 2022-12-09 MED ORDER — LEVOTHYROXINE SODIUM 125 MCG PO TABS
125.0000 ug | ORAL_TABLET | Freq: Every morning | ORAL | 0 refills | Status: AC
  Filled 2022-12-09: qty 90, 90d supply, fill #0

## 2022-12-09 MED ORDER — LEVOTHYROXINE SODIUM 125 MCG PO TABS
125.0000 ug | ORAL_TABLET | Freq: Every morning | ORAL | 4 refills | Status: AC
  Filled 2022-12-09: qty 90, 90d supply, fill #0

## 2023-03-31 ENCOUNTER — Other Ambulatory Visit: Payer: Managed Care, Other (non HMO)

## 2023-03-31 ENCOUNTER — Other Ambulatory Visit: Payer: Self-pay | Admitting: Family Medicine

## 2023-03-31 DIAGNOSIS — R079 Chest pain, unspecified: Secondary | ICD-10-CM

## 2023-08-04 ENCOUNTER — Other Ambulatory Visit: Payer: Self-pay | Admitting: Family Medicine

## 2023-08-04 ENCOUNTER — Other Ambulatory Visit: Payer: Self-pay | Admitting: Internal Medicine

## 2023-08-04 ENCOUNTER — Ambulatory Visit
Admission: RE | Admit: 2023-08-04 | Discharge: 2023-08-04 | Disposition: A | Discharge status: Home / Self Care | Payer: Managed Care, Other (non HMO) | Source: Ambulatory Visit | Attending: Family Medicine | Admitting: Family Medicine

## 2023-08-04 DIAGNOSIS — Z1231 Encounter for screening mammogram for malignant neoplasm of breast: Secondary | ICD-10-CM
